# Patient Record
Sex: Male | Born: 1940 | Race: White | Hispanic: No | Marital: Single | Smoking: Former smoker
Health system: Southern US, Community
[De-identification: ages and names within clinical notes are randomized; demographics above are authoritative.]

## PROBLEM LIST (undated history)

## (undated) DIAGNOSIS — N529 Male erectile dysfunction, unspecified: Secondary | ICD-10-CM

## (undated) DIAGNOSIS — F329 Major depressive disorder, single episode, unspecified: Secondary | ICD-10-CM

## (undated) DIAGNOSIS — E291 Testicular hypofunction: Secondary | ICD-10-CM

## (undated) DIAGNOSIS — N400 Enlarged prostate without lower urinary tract symptoms: Secondary | ICD-10-CM

## (undated) DIAGNOSIS — N433 Hydrocele, unspecified: Secondary | ICD-10-CM

## (undated) DIAGNOSIS — K759 Inflammatory liver disease, unspecified: Secondary | ICD-10-CM

## (undated) DIAGNOSIS — F32A Depression, unspecified: Secondary | ICD-10-CM

## (undated) DIAGNOSIS — I10 Essential (primary) hypertension: Secondary | ICD-10-CM

## (undated) DIAGNOSIS — E785 Hyperlipidemia, unspecified: Secondary | ICD-10-CM

## (undated) DIAGNOSIS — F419 Anxiety disorder, unspecified: Secondary | ICD-10-CM

## (undated) DIAGNOSIS — N486 Induration penis plastica: Secondary | ICD-10-CM

## (undated) DIAGNOSIS — G4730 Sleep apnea, unspecified: Secondary | ICD-10-CM

## (undated) HISTORY — DX: Major depressive disorder, single episode, unspecified: F32.9

## (undated) HISTORY — DX: Male erectile dysfunction, unspecified: N52.9

## (undated) HISTORY — DX: Essential (primary) hypertension: I10

## (undated) HISTORY — DX: Induration penis plastica: N48.6

## (undated) HISTORY — DX: Hyperlipidemia, unspecified: E78.5

## (undated) HISTORY — DX: Sleep apnea, unspecified: G47.30

## (undated) HISTORY — PX: EYE SURGERY: SHX253

## (undated) HISTORY — DX: Benign prostatic hyperplasia without lower urinary tract symptoms: N40.0

## (undated) HISTORY — DX: Depression, unspecified: F32.A

## (undated) HISTORY — DX: Anxiety disorder, unspecified: F41.9

## (undated) HISTORY — DX: Testicular hypofunction: E29.1

## (undated) HISTORY — DX: Hydrocele, unspecified: N43.3

## (undated) HISTORY — PX: CATARACT EXTRACTION: SUR2

## (undated) HISTORY — PX: HYDROCELE EXCISION: SHX482

## (undated) HISTORY — DX: Inflammatory liver disease, unspecified: K75.9

---

## 1985-01-18 HISTORY — PX: FINGER AMPUTATION: SHX636

## 1988-01-19 HISTORY — PX: VARICOCELECTOMY: SHX1084

## 2006-04-29 ENCOUNTER — Ambulatory Visit: Payer: Self-pay | Admitting: Gastroenterology

## 2011-10-14 ENCOUNTER — Emergency Department: Payer: Self-pay | Admitting: Emergency Medicine

## 2011-10-14 ENCOUNTER — Observation Stay: Payer: Self-pay | Admitting: Internal Medicine

## 2011-10-14 LAB — PROTIME-INR
INR: 0.9
Prothrombin Time: 12.3 secs (ref 11.5–14.7)

## 2011-10-14 LAB — CBC
HCT: 46 % (ref 40.0–52.0)
MCH: 31.5 pg (ref 26.0–34.0)
MCHC: 34.8 g/dL (ref 32.0–36.0)
MCV: 91 fL (ref 80–100)
RDW: 12.9 % (ref 11.5–14.5)

## 2011-10-14 LAB — COMPREHENSIVE METABOLIC PANEL
Anion Gap: 11 (ref 7–16)
BUN: 10 mg/dL (ref 7–18)
Bilirubin,Total: 0.8 mg/dL (ref 0.2–1.0)
Chloride: 102 mmol/L (ref 98–107)
Co2: 26 mmol/L (ref 21–32)
Creatinine: 0.84 mg/dL (ref 0.60–1.30)
EGFR (African American): >60
EGFR (Non-African Amer.): >60
Glucose: 97 mg/dL (ref 65–99)
Osmolality: 277 (ref 275–301)
SGPT (ALT): 30 U/L (ref 12–78)
Sodium: 139 mmol/L (ref 136–145)
Total Protein: 7.8 g/dL (ref 6.4–8.2)

## 2011-10-14 LAB — APTT: Activated PTT: 25.5 secs (ref 23.6–35.9)

## 2011-10-15 LAB — CBC WITH DIFFERENTIAL/PLATELET
Eosinophil #: 0.1 10*3/uL (ref 0.0–0.7)
Eosinophil %: 1.7 %
Lymphocyte #: 2.2 10*3/uL (ref 1.0–3.6)
MCH: 30.8 pg (ref 26.0–34.0)
MCHC: 33.7 g/dL (ref 32.0–36.0)
MCV: 91 fL (ref 80–100)
Monocyte #: 1.2 x10 3/mm — ABNORMAL HIGH (ref 0.2–1.0)
Neutrophil #: 5.3 10*3/uL (ref 1.4–6.5)
Neutrophil %: 59.7 %
Platelet: 329 10*3/uL (ref 150–440)
RBC: 5.1 10*6/uL (ref 4.40–5.90)
RDW: 13.1 % (ref 11.5–14.5)

## 2011-10-15 LAB — LIPID PANEL
Cholesterol: 199 mg/dL (ref 0–200)
Ldl Cholesterol, Calc: 96 mg/dL (ref 0–100)
Triglycerides: 89 mg/dL (ref 0–200)
VLDL Cholesterol, Calc: 18 mg/dL (ref 5–40)

## 2011-10-15 LAB — BASIC METABOLIC PANEL
Anion Gap: 8 (ref 7–16)
BUN: 15 mg/dL (ref 7–18)
Chloride: 103 mmol/L (ref 98–107)
Co2: 27 mmol/L (ref 21–32)
Creatinine: 0.92 mg/dL (ref 0.60–1.30)
EGFR (African American): >60
Osmolality: 276 (ref 275–301)
Potassium: 4.2 mmol/L (ref 3.5–5.1)

## 2014-01-28 ENCOUNTER — Ambulatory Visit: Payer: Self-pay | Admitting: Urology

## 2014-02-12 ENCOUNTER — Ambulatory Visit: Payer: Self-pay | Admitting: Anesthesiology

## 2014-02-12 DIAGNOSIS — I10 Essential (primary) hypertension: Secondary | ICD-10-CM

## 2014-02-20 ENCOUNTER — Ambulatory Visit: Payer: Self-pay | Admitting: Urology

## 2014-03-28 ENCOUNTER — Ambulatory Visit: Payer: Self-pay | Admitting: Urology

## 2014-05-07 NOTE — Discharge Summary (Signed)
PATIENT NAME:  Brady Bryant, Brady Bryant MR#:  [id number] DATE OF BIRTH:  November 08, 1940  DATE OF ADMISSION:  10/14/2011 DATE OF DISCHARGE:  10/15/2011  PRIMARY CARE PHYSICIAN: [facility]    FINAL DIAGNOSES:  1. Transient ischemic attack.  2. Elevated blood pressure without diagnosis of hypertension.  3. Chest pain, which is transient. Recommend outpatient stress test.  4. Alcohol use, cut back to one drink per day.   MEDICATIONS ON DISCHARGE:  1. Aspirin 81 mg daily.  2. Atorvastatin 20 mg daily.   DO NOT TAKE: Ibuprofen.   DIET: Low sodium diet, less than 2400 mg of sodium chloride per day, regular consistency.   ACTIVITY: Activity as tolerated.   [facility]: [facility] Dr. [place]    REASON FOR ADMISSION: The patient was admitted 10/14/2011, discharged 10/15/2011. The patient was in the Emergency Room, signed out AGAINST MEDICAL ADVICE, and came back to the Emergency Room presented with left-sided numbness. The patient was admitted for transient ischemic attack. MRI of the brain, echocardiogram, and carotid ultrasound was ordered. Aspirin and Lipitor was started.   LABORATORY AND RADIOLOGICAL DATA DURING THE HOSPITAL COURSE: EKG showed normal sinus rhythm, no acute ST-T wave changes.   Glucose 97, BUN 10, creatinine 0.84, sodium 139, potassium 3.7, chloride 102, CO2 26, calcium 9.5. Liver function tests normal. White blood cell count 9.3, hemoglobin and hematocrit 16.0 and 46.0, platelet count 346. PT, INR, and PTT normal range. Troponin initially negative.   Chest x-ray negative.   CT scan of the head showed no acute intracranial abnormality.   Ultrasound of the carotids showed no sonographic evidence of hemodynamically significant stenosis with either the left or right carotid system.   LDL 96. HDL 85. Triglycerides 89.  MRI of the brain shows no acute intracranial pathology.   Echocardiogram pending upon discharge.   HOSPITAL COURSE PER PROBLEM LIST:   1. For the patient's transient ischemic attack, his left-sided symptoms had resolved. The patient has good power, 5 out of 5, bilaterally. The patient was anxious to go home prior to echocardiogram being resulted. Since the patient's MRI of the brain and carotid ultrasound were negative, it is okay for him to go home on aspirin and Lipitor. I will treat this as a transient ischemic attack.  2. Elevated blood pressure without diagnosis of hypertension. The patient is very anxious here. I believe that may have some role with his blood pressure. His last blood pressure was 146/96 but it was 134/81 prior. I do recommend following up as outpatient to check blood pressure.  3. For the patient's chest pain which was transient, I recommend an outpatient stress test. Unfortunately, cardiac enzymes were not ordered during the hospital course. No arrhythmias on telemetry. Continue aspirin.  4. Alcohol use. I advised the patient to cut back to one drink per day.   TIME SPENT ON DISCHARGE: 35 minutes.   ____________________________ Herschell Dimesichard J. Renae GlossWieting, MD rjw:drc D: 10/15/2011 15:31:31 ET T: 10/16/2011 13:49:40 ET JOB#: [id number]  cc: Herschell Dimesichard J. Renae GlossWieting, MD, <Dictator>, [facility] Salley ScarletICHARD J Yochanan Eddleman MD ELECTRONICALLY SIGNED 10/19/2011 21:24

## 2014-05-07 NOTE — H&P (Signed)
PATIENT NAME:  Brady Bryant, Brady MR#:  [id number] DATE OF BIRTH:  [birth year]  DATE OF ADMISSION:  10/14/2011  PRIMARY CARE PHYSICIAN:  None    HISTORY OF PRESENT ILLNESS:  The patient is a 74 year old Caucasian male with past medical history significant for history of hyperlipidemia as well as prostatitis who presented to  the Emergency Room twice today. The first time he came in early in the morning when he had some chest pains and shortness of breath. He also noted that his left lower extremity and left arm were numb. He also had left lower extremity weakness. It lasted approximately 1-1/2 hours and he decided to come to the Emergency Room for further evaluation. In the Emergency Room his symptoms subsided. He had a CT scan of his head, which was unremarkable. He was given some aspirin and he returned back home AGAINST MEDICAL ADVICE. At around 12:00 or 1:00 p.m. he again started having problems with left-sided numbness as well as left lower extremity weakness. He was not even able to put weight on this leg. He could not walk. This weakness lasted approximately four hours.  This time he came to the Emergency Room for further evaluation and decided to stay. The Emergency Room did not repeat the patient's CT scan this time as his symptoms again subsided while in the Emergency Room. Now he feels quite okay. He does not have any upper or lower extremity weakness or numbness. His chest pain was described as midsternal, a dull ache approximately 2 to 4 out of 10 in intensity, lasting for a few hours, accompanied by shortness of breath. Pain was constant with no radiation and it was not associated with changes with deep breathing or walking around. He had no recurrence of his chest pains, however had some shortness of breath again in the middle of the day.   PAST MEDICAL HISTORY:  1. Hyperlipidemia. 2. Prostatitis.   MEDICATIONS: None, however he took a few aspirins earlier today.   PAST SURGICAL HISTORY:   1. 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  2. Varicocele operation.   FAMILY HISTORY: Hypertension in patient's brother. The patient's mother had cerebrovascular accident at the age of 72. No cancers, early coronary artery disease or cerebrovascular accidents.   SOCIAL HISTORY: The patient is married, has three children total. He smoked 1 pack per day for 20 years, quit in 1987. Drinks alcohol, two to three glasses at night. He usually buys approximately a 2.5-liter box for a week. He is retired, however, he works intermittently.  REVIEW OF SYSTEMS:  Positive for chest pain, shortness of breath, weight gain as well as loss. He states that he lost approximately 20 pounds in the past eight months. He uses bifocal glasses. He has been having shortness of breath over the past day. He had early in the morning shortness of breath associated with chest pains as well as later today. He had also left-sided numbness as well as left lower extremity weakness. He was not able to support his weight. Otherwise, denies any fevers, chills, fatigue, weakness, weight loss. EYES: In regards to eyes, denies any blurred vision, double vision, glaucoma, or cataracts.  ENT: Denies any tinnitus, allergies, epistaxis, sinus pain, dentures, or difficulty swallowing.  RESPIRATORY:  Denies any cough, wheezes, asthma, or chronic obstructive pulmonary disease. CARDIOVASCULAR: Denies any orthopnea, edema, arrhythmias, palpitations, or syncope.  GI: Denies nausea, vomiting, diarrhea, or constipation. GU: Denies dysuria, hematuria, frequency, or incontinence. ENDOCRINE:  Denies polydipsia, nocturia, thyroid  problems, heat or cold intolerance, or thirst.  HEMATOLOGIC: Denies anemia, easy bruising or bleeding, or swollen glands. SKIN: Denies any acne, rashes, lesions, or change in moles. MUSCULOSKELETAL: Denies arthritis, cramps, swelling, or gout. NEUROLOGIC: No numbness, epilepsy, or tremor. PSYCH:  Denies anxiety,  insomnia, or depression.   PHYSICAL EXAMINATION:  VITAL SIGNS: On arrival to the hospital, temperature 97.9, pulse 98, respiratory rate 22, blood pressure 160/91, saturation 96% room air.   GENERAL: This is a thin Caucasian male in no significant distress sitting on the stretcher.   HEENT: His pupils are equal and reactive to light.  Extraocular movements intact.  No icterus or conjunctivitis.  He has normal hearing. No pharyngeal erythema. Mucosa is moist.   NECK: No masses. Supple, nontender. Thyroid not enlarged. No adenopathy. No JVD or carotid bruits bilaterally. Full range of motion.   LUNGS: Clear to auscultation in all fields. No rales, rhonchi, diminished breath sounds, or wheezing. No labored inspirations, increased effort, dullness to percussion, or overt respiratory distress.   CARDIOVASCULAR: S1, S2.  No murmurs, rubs, or gallops noted.  PMI not lateralized. Chest is nontender to palpation.  2+ pedal pulses. No lower extremity edema, calf tenderness, or cyanosis was noted.   ABDOMEN: Soft, nontender. Bowel sounds are present. No hepatosplenomegaly or masses were noted.   RECTAL: Deferred.   MUSCULOSKELETAL: Muscle strength. Able to move all extremities, 5/5. No cyanosis, degenerative joint disease, or kyphosis. Gait is not tested.   SKIN: Skin did not reveal any rashes, lesions, erythema, nodularity, or induration. It was warm and dry to palpation.   LYMPH: No adenopathy in the cervical region.   NEUROLOGICAL: Cranial nerves grossly intact. Sensory is intact. The patient does have Babinski on the left. No dysarthria or aphasia.   PSYCHIATRIC: The patient is alert, oriented to time, person, and place, cooperative. Memory is good. No significant confusion, agitation, or depression noted.   LABORATORY, DIAGNOSTIC, AND RADIOLOGICAL DATA: BNP was within normal limits. The patient's liver enzymes were normal. Troponin was less than 0.02. The patient's white blood cell count is  9.3, hemoglobin 16, platelet count 346. Coagulation panel unremarkable.   Radiologic studies: Chest portable, single view, 10/14/2011 showed no evidence of acute cardiopulmonary abnormality. CT scan of the head without contrast: No acute intracranial abnormality.   ASSESSMENT AND PLAN:  1. Transient ischemic attack, recurrent: Admit the patient to the medical floor.  Continue him on aspirin as well as Lipitor. Get lipid panel in the morning. Get echocardiogram as well as carotid ultrasound. If his symptoms recur, we may need to have an MRI of his brain. Meanwhile he is asymptomatic at this time except for mild Babinski on the left. 2. Elevated blood pressure without diagnosis of hypertension: We will watch the patient's blood pressure readings and initiate blood pressure medications if needed.  3. Chest pain concerning for unstable angina: We will get an echocardiogram. We will continue aspirin and Lipitor. The patient will need a stress test as an outpatient.  4. Alcohol abuse: Watch for withdrawal.  5. EKG: The patient's EKG revealed normal sinus rhythm at 84 beats per minute, normal axis, minimal voltage criteria for left ventricular hypertrophy, may be normal variant. No acute ST-T changes were noted.   TIME SPENT: 50 minutes.    ____________________________ Katharina Caper, MD rv:bjt D: 10/14/2011 18:10:30 ET T: 10/15/2011 06:54:41 ET JOB#: [id number]  cc: Katharina Caper, MD, <Dictator> Rusty Villella MD ELECTRONICALLY SIGNED 10/15/2011 15:11

## 2014-05-13 LAB — SURGICAL PATHOLOGY

## 2014-05-19 NOTE — Op Note (Signed)
PATIENT NAME:  Brady Bryant, Brady Bryant MR#:  [id number] DATE OF BIRTH:  07-08-1940  DATE OF PROCEDURE:  02/20/2014  PREOPERATIVE DIAGNOSIS:  Hydrocele.  POSTOPERATIVE DIAGNOSIS:  Hydrocele, spermatocele.   PROCEDURE PERFORMED: Hydrocelectomy, spermatocelectomy.   ATTENDING SURGEON: Claris GladdenAshley J. Zakery Normington, MD   ANESTHESIA: General anesthesia.   ESTIMATED BLOOD LOSS: Minimal.   DRAINS: None.   COMPLICATIONS: None.   SPECIMENS: Hydrocele sac.   INDICATION: This is a 74 year old male with a symptomatic right hydrocele. He was counseled on his various treatment options and has elected to undergo right hydrocelectomy.  Risks and benefits of the procedure were explained in detail. The patient agreed to proceed as planned.   DESCRIPTION OF PROCEDURE: The patient was correctly identified in the preoperative holding area and informed consent was obtained. He was brought to the operating suite and placed on the table in the supine position. At this time universal timeout protocol was performed. All team members were identified. Venodyne boots were placed and he was administered IV Ancef in the perioperative period. He was then placed under general anesthesia and shaved by the attending surgeon and prepped using ChloraPrep and draped in the standard surgical fashion. At this point in time, the scrotum was examined which revealed a moderately tense right-sided hydrocele approximately softball size.   The site of planned incision was performed over the right hemiscrotum in a transverse fashion along Langerhans lines; 0.5% plain local anesthesia was administered at the site of the planned incision, and a knife was used to incise the skin and the subcutaneous tissues were taken down using Bovie electrocautery. The hydrocele sac and testicle were then able to be delivered through the incision into the field. The hydrocele sac was then incised and the fluid from the hydrocele was aspirated, approximately 200 mL of  straw-colored fluid was evacuated. The edges of the hydrocele sac were then excised with care taken to avoid any injury to the cord or epididymis. The edges of the hydrocele sac were then oversewn and everted to prevent recurrence of the hydrocele sac. The remainder of the scrotum was carefully inspected and hemostasis was achieved. This was then copiously irrigated with antibiotic solution. There was an approximately 2 cm cyst on the epididymis consistent with a spermatocele and this structure was carefully dissected free and removed as well. The testicle was then returned right back into the right hemiscrotum. The scrotum was then closed in 2 layers using a running 3-0 Vicryl to close the dartos and a series of interrupted chromic sutures to close the scrotal skin. Dermabond solution was then used as an additional dressing. A scrotal fluff and scrotal support device was applied. The patient was reversed from anesthesia and taken to the PACU in stable condition. There were no complications in this case.   ____________________________ Claris GladdenAshley J. Kimiah Hibner, MD ajb:LT D: 02/20/2014 14:23:21 ET T: 02/20/2014 16:29:46 ET JOB#: [id number]  cc: Claris GladdenAshley J. Jamilee Lafosse, MD, <Dictator> Claris GladdenASHLEY J Purnell Daigle MD ELECTRONICALLY SIGNED 03/12/2014 12:35

## 2014-10-25 ENCOUNTER — Telehealth: Payer: Self-pay

## 2014-10-25 ENCOUNTER — Ambulatory Visit (INDEPENDENT_AMBULATORY_CARE_PROVIDER_SITE_OTHER): Payer: Medicare Other | Admitting: Urology

## 2014-10-25 ENCOUNTER — Encounter: Payer: Self-pay | Admitting: Urology

## 2014-10-25 VITALS — BP 182/90 | HR 76 | Ht 67.0 in | Wt 144.4 lb

## 2014-10-25 DIAGNOSIS — N529 Male erectile dysfunction, unspecified: Secondary | ICD-10-CM | POA: Diagnosis not present

## 2014-10-25 DIAGNOSIS — N433 Hydrocele, unspecified: Secondary | ICD-10-CM

## 2014-10-25 LAB — URINALYSIS, COMPLETE
Bilirubin, UA: NEGATIVE
Glucose, UA: NEGATIVE
Ketones, UA: NEGATIVE
LEUKOCYTES UA: NEGATIVE
Nitrite, UA: NEGATIVE
PH UA: 7 (ref 5.0–7.5)
PROTEIN UA: NEGATIVE
RBC, UA: NEGATIVE
Specific Gravity, UA: 1.01 (ref 1.005–1.030)
Urobilinogen, Ur: 0.2 mg/dL (ref 0.2–1.0)

## 2014-10-25 LAB — MICROSCOPIC EXAMINATION
BACTERIA UA: NONE SEEN
Epithelial Cells (non renal): NONE SEEN /hpf (ref 0–10)
RBC, UA: NONE SEEN /hpf (ref 0–?)
RENAL EPITHEL UA: NONE SEEN /HPF
WBC, UA: NONE SEEN /hpf (ref 0–?)

## 2014-10-25 NOTE — Progress Notes (Signed)
10/25/2014 9:38 AM   Brady Bryant Jun 23, 1940 [id number]  Referring provider: No referring provider defined for this encounter.  Chief Complaint  Patient presents with  . Hydrocele    74month    HPI: The patient is a 74 year old male who presents for follow-up for his hydrocelectomy and erectile disruption. He had a hydrocelectomy on fiber third 2016. He had some swelling and tenderness after surgery. This is mostly resolved. There is no more fluid collection per the patient.  The patient also has erectile dysfunction.  He failed oral PDE 5 inhibitors. He has been on Trimix. This is working well. He will a refill this medication.    PMH: Past Medical History  Diagnosis Date  . ED (erectile dysfunction)   . BPH (benign prostatic hyperplasia)   . Hypogonadism in male   . Peyronie disease   . Anxiety and depression   . Sleep apnea   . Hyperlipemia   . Hepatitis   . Hydrocele, right   . Hypertension     Surgical History: Past Surgical History  Procedure Laterality Date  . Hydrocele excision  [id number]  . Finger amputation  1987  . Cataract extraction Right   . Varicocelectomy  1990    [facility]  . Eye surgery Right     Home Medications:    Medication List    Notice  As of 10/25/2014  9:38 AM   You have not been prescribed any medications.      Allergies: No Known Allergies  Family History: Family History  Problem Relation Age of Onset  . Stomach cancer Mother   . Prostate cancer Neg Hx     Social History:  reports that he has quit smoking. He does not have any smokeless tobacco history on file. He reports that he drinks alcohol. He reports that he does not use illicit drugs.  ROS: UROLOGY Frequent Urination?: No Hard to postpone urination?: No Burning/pain with urination?: No Get up at night to urinate?: No Leakage of urine?: No Urine stream starts and stops?: No Trouble starting stream?: No Do you have to strain to urinate?: No Blood in urine?:  No Urinary tract infection?: No Sexually transmitted disease?: No Injury to kidneys or bladder?: No Painful intercourse?: No Weak stream?: No Erection problems?: No Penile pain?: No  Gastrointestinal Nausea?: No Vomiting?: No Indigestion/heartburn?: No Diarrhea?: No Constipation?: No  Constitutional Fever: No Night sweats?: No Weight loss?: No Fatigue?: No  Skin Skin rash/lesions?: No Itching?: No  Eyes Blurred vision?: No Double vision?: No  Ears/Nose/Throat Sore throat?: No Sinus problems?: No  Hematologic/Lymphatic Swollen glands?: No Easy bruising?: No  Cardiovascular Leg swelling?: No Chest pain?: No  Respiratory Cough?: No Shortness of breath?: No  Endocrine Excessive thirst?: No  Musculoskeletal Back pain?: No Joint pain?: No  Neurological Headaches?: No Dizziness?: No  Psychologic Depression?: No Anxiety?: No  Physical Exam: BP 182/90 mmHg  Pulse 76  Ht  (1.702 m)  Wt 144 lb 6.4 oz (65.499 kg)  BMI 22.61 kg/m2  Constitutional:  Alert and oriented, No acute distress. HEENT: [place] AT, moist mucus membranes.  Trachea midline, no masses. Cardiovascular: No clubbing, cyanosis, or edema. Respiratory: Normal respiratory effort, no increased work of breathing. GI: Abdomen is soft, nontender, nondistended, no abdominal masses GU: No CVA tenderness. Normal phallus. Testicles descended equally bilaterally. Right testicle is somewhat tender to palpation. No sign of infection. No hydrocele. Skin: No rashes, bruises or suspicious lesions. Lymph: No cervical or inguinal adenopathy. Neurologic: Grossly intact,  no focal deficits, moving all 4 extremities. Psychiatric: Normal mood and affect.  Laboratory Data: Lab Results  Component Value Date   WBC 8.8 10/15/2011   HGB 15.7 10/15/2011   HCT 46.5 10/15/2011   MCV 91 10/15/2011   PLT 329 10/15/2011    Lab Results  Component Value Date   CREATININE 0.92 10/15/2011    No results found  for: PSA  No results found for: TESTOSTERONE  No results found for: HGBA1C  Urinalysis No results found for: COLORURINE, APPEARANCEUR, LABSPEC, PHURINE, GLUCOSEU, HGBUR, BILIRUBINUR, KETONESUR, PROTEINUR, UROBILINOGEN, NITRITE, LEUKOCYTESUR    Assessment & Plan:    1. Hydrocele, unspecified hydrocele type Resolved  2. Erectile dysfunction, unspecified erectile dysfunction type Continue Trimix. A prescription was called in for a refill. He'll follow-up in one year's time to reevaluate his erectile dysfunction.   Return in about 1 year (around 10/25/2015).  Hildred Laser, MD  [facility] [address] [place], [place] [address] [phone]

## 2014-10-25 NOTE — Telephone Encounter (Signed)
Trimix medication was called into [facility].

## 2015-10-28 ENCOUNTER — Telehealth: Payer: Self-pay | Admitting: Urology

## 2015-10-28 NOTE — Telephone Encounter (Signed)
I had to move the patient's appt from 10-31-15 to 11-07-15 due to no provider in the office and his script for Trimix ran out on 10-25-15 can he get another one called in to the pharmacy before his appt on the 20th? Please let the patient know.  [phone]   Thanks,  michelle

## 2015-10-28 NOTE — Telephone Encounter (Signed)
Refills called into pharmacy. Pt made aware.

## 2015-10-31 ENCOUNTER — Ambulatory Visit: Payer: Medicare Other

## 2015-10-31 IMAGING — US US SCROTUM W/ DOPPLER COMPLETE
1 series · 14 of 25 positions shown · non-contrast
Comparison: None.

CLINICAL DATA: Right hydrocele. Constant dull ache and swelling in
the right testicle. Increased discomfort over the past few months.

EXAM:
SCROTAL ULTRASOUND
DOPPLER ULTRASOUND OF THE TESTICLES
TECHNIQUE: Complete ultrasound examination of the testicles, epididymis, and
other scrotal structures was performed. Color and spectral Doppler
ultrasound were also utilized to evaluate blood flow to the
testicles.

[Series 1: us scrotum w/ doppler complete · 0.10mm/px · 14 of 65 slices shown]
[im 1/65]
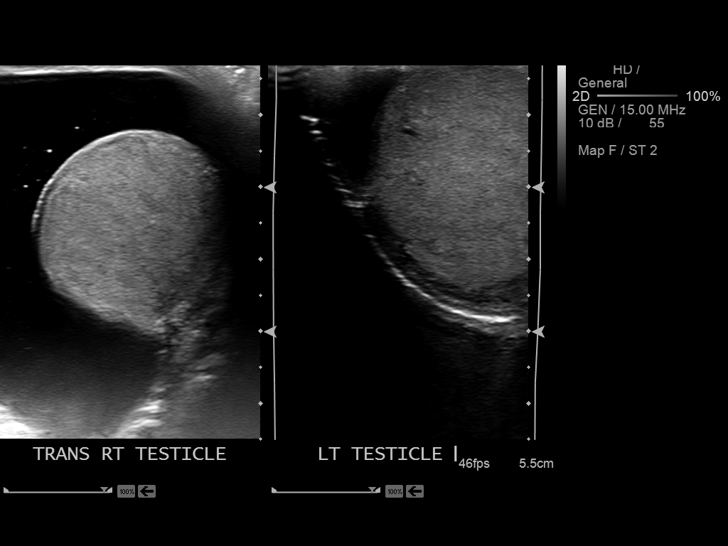
[im 6/65]
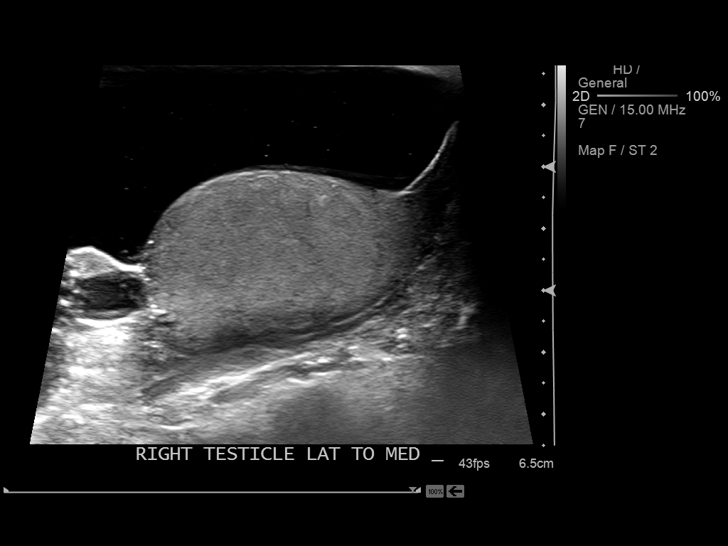
[im 11/65]
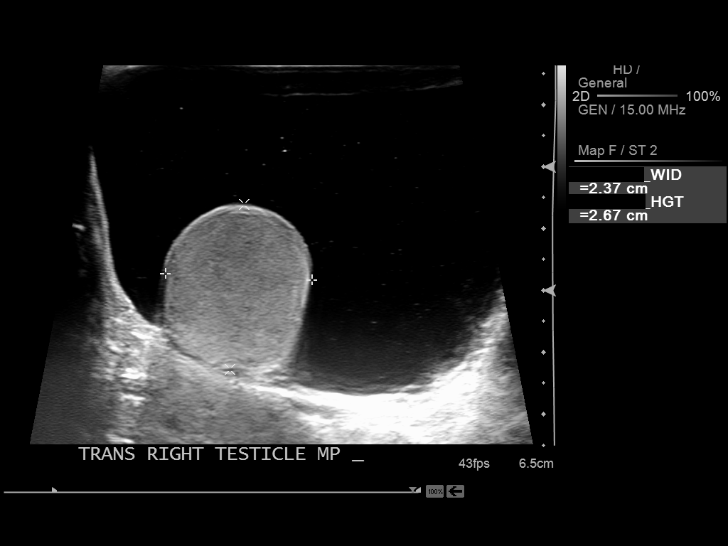
[im 17/65]
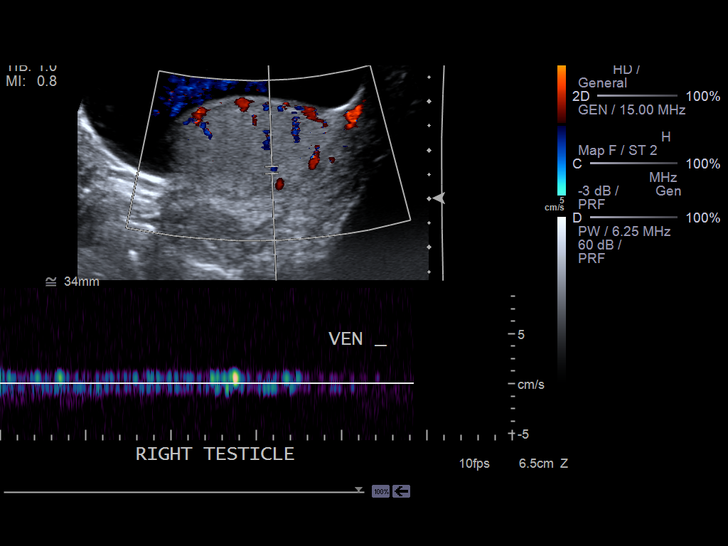
[im 22/65]
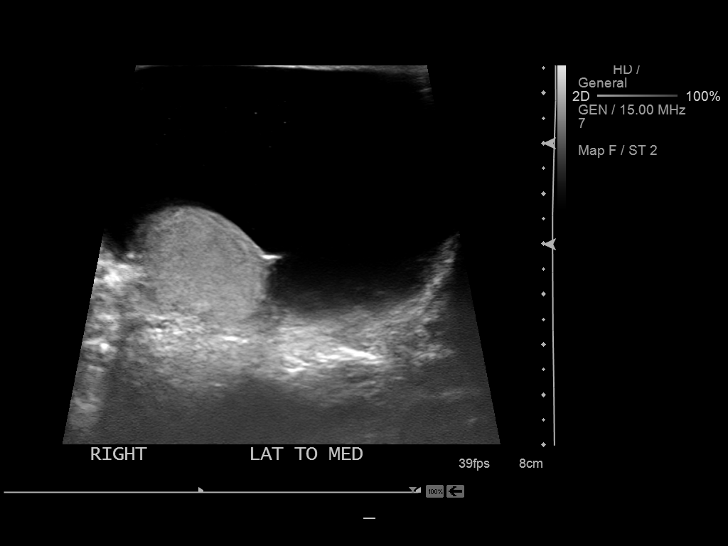
[im 25/65]
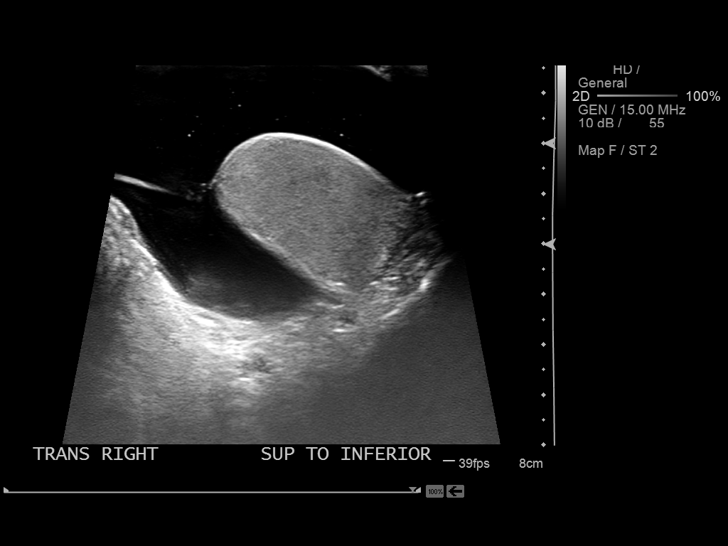
[im 30/65]
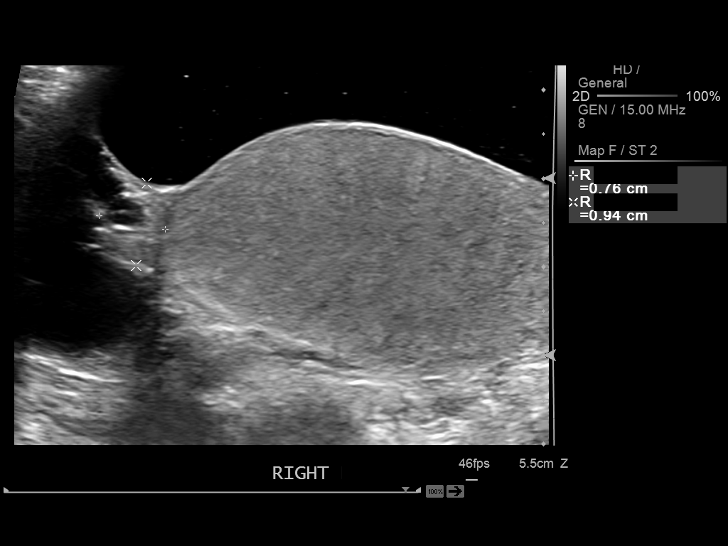
[im 35/65]
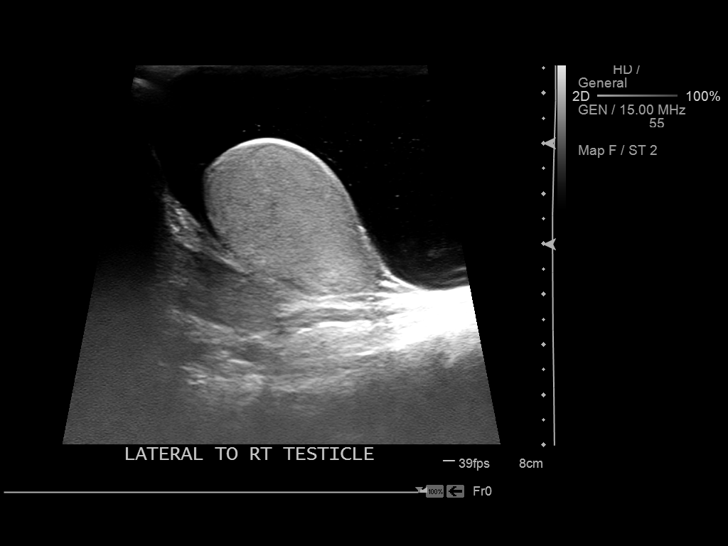
[im 41/65]
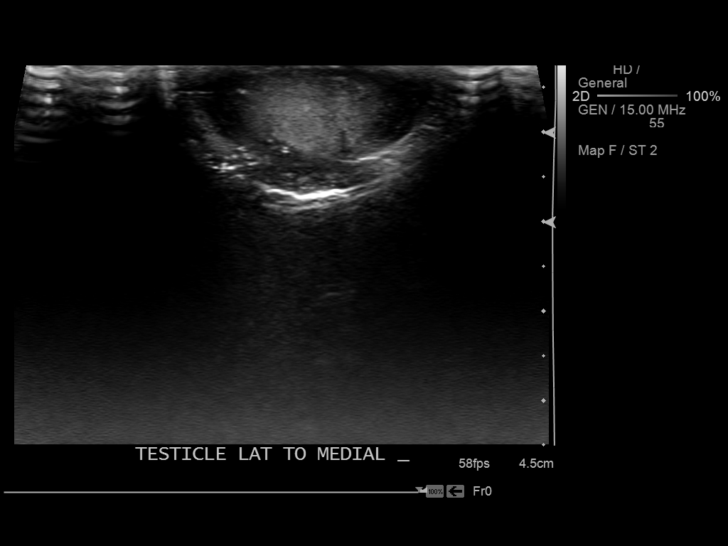
[im 43/65]
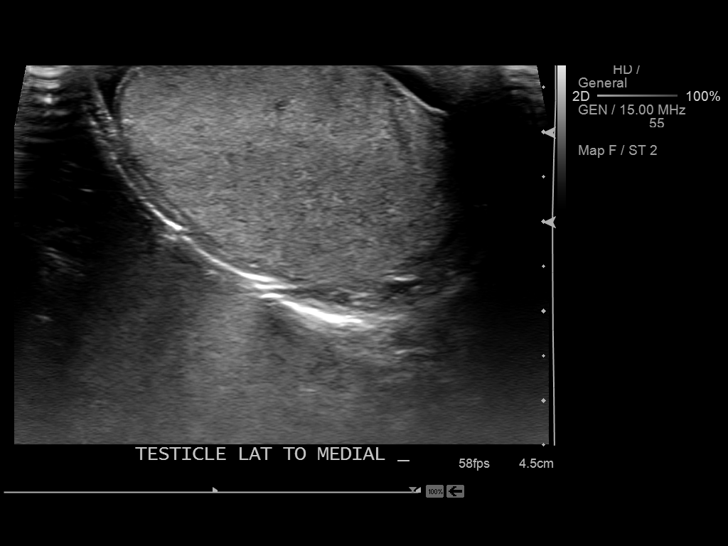
[im 49/65]
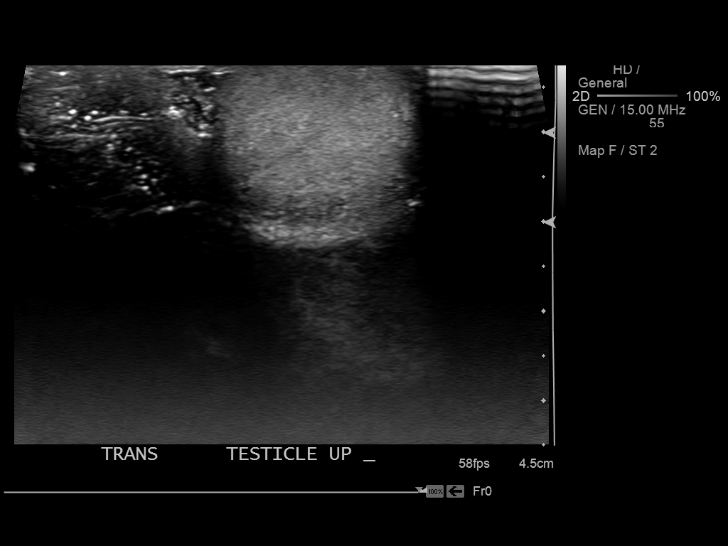
[im 54/65]
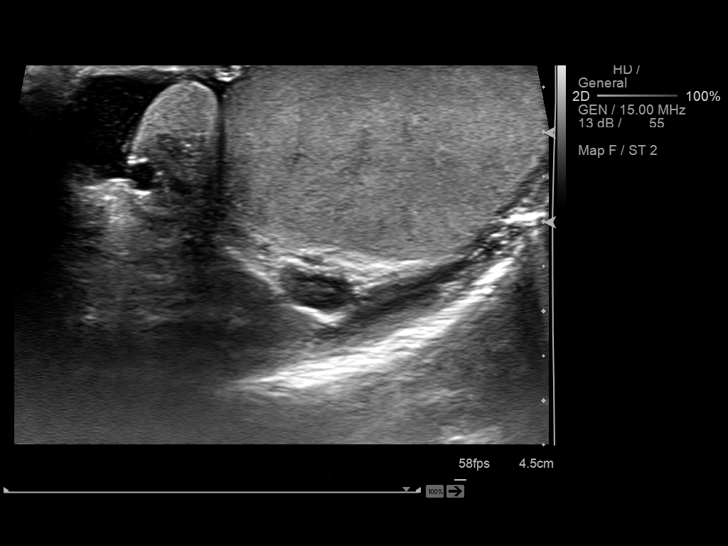
[im 59/65]
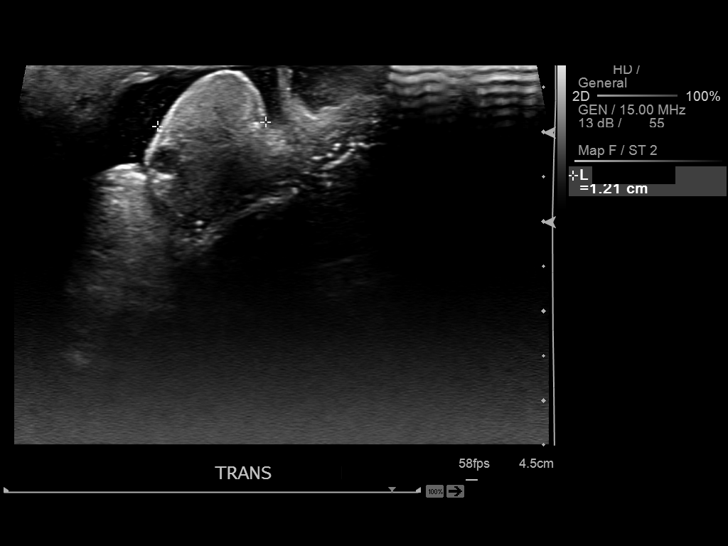
[im 65/65]
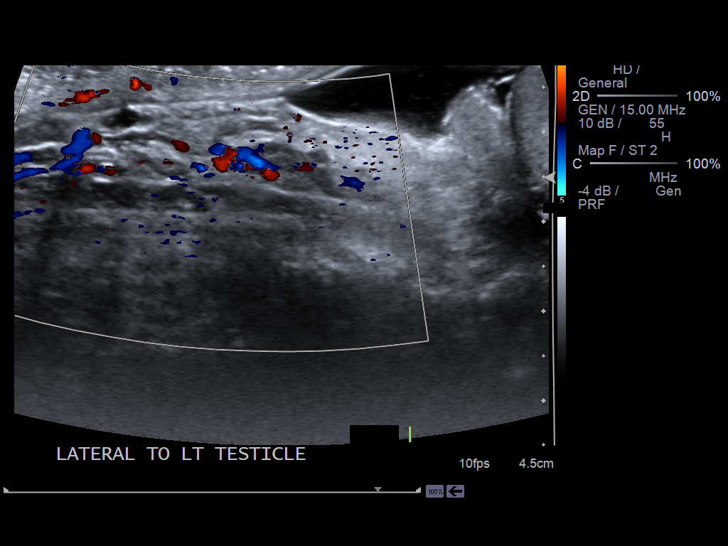

[14 of 25 positions shown; findings below may reference images not displayed]

FINDINGS: Right testicle

Measurements: 4.6 x 2.7 x 2.4 cm. No mass or microlithiasis
visualized.

Left testicle

Measurements: 4.1 x 2.8 x 3.1 cm. No mass or microlithiasis
visualized.

Right epididymis: Small epididymal cyst/spermatocele is 0.4 x 0.3 x
0.5 cm

Left epididymis: Small epididymal cyst/spermatocele is 0.4 x 0.3 x
0.4 Cm

Hydrocele:  Large right hydrocele is present.

Varicocele:  None visualized.

Pulsed Doppler interrogation of both testes demonstrates low
resistance arterial and venous waveforms bilaterally.
IMPRESSION: 1. No evidence for testicular mass or torsion.
2. Large right hydrocele.

## 2015-11-07 ENCOUNTER — Ambulatory Visit (INDEPENDENT_AMBULATORY_CARE_PROVIDER_SITE_OTHER): Payer: Medicare Other | Admitting: Urology

## 2015-11-07 ENCOUNTER — Encounter: Payer: Self-pay | Admitting: Urology

## 2015-11-07 VITALS — BP 182/112 | HR 91 | Ht 67.0 in | Wt 149.8 lb

## 2015-11-07 DIAGNOSIS — N529 Male erectile dysfunction, unspecified: Secondary | ICD-10-CM | POA: Diagnosis not present

## 2015-11-07 DIAGNOSIS — N430 Encysted hydrocele: Secondary | ICD-10-CM | POA: Diagnosis not present

## 2015-11-07 NOTE — Progress Notes (Signed)
11/07/2015 2:27 PM   Brady Bryant 1940-05-19 [id number]  Referring provider: No referring provider defined for this encounter.  Chief Complaint  Patient presents with  . Hydrocele    1 yr f/u    HPI: The patient is a 75 year old gentleman who presents for annual follow-up.   1. Erectile dysfunction Managed well with Trimix. No complaints.  2. Right Hydrocele Status post hydrocelectomy in December 2016.     Patient denies any other complaints at this time. He denies nocturia. He denies urinary issues. No urinary tract infections, hematuria or nephrolithiasis over last year.  PMH: Past Medical History:  Diagnosis Date  . Anxiety and depression   . BPH (benign prostatic hyperplasia)   . ED (erectile dysfunction)   . Hepatitis   . Hydrocele, right   . Hyperlipemia   . Hypertension   . Hypogonadism in male   . Peyronie disease   . Sleep apnea     Surgical History: Past Surgical History:  Procedure Laterality Date  . CATARACT EXTRACTION Right   . EYE SURGERY Right   . FINGER AMPUTATION  1987  . HYDROCELE EXCISION  [id number]  . VARICOCELECTOMY  1990   [facility]    Home Medications:    Medication List    as of 11/07/2015  2:27 PM   You have not been prescribed any medications.     Allergies: No Known Allergies  Family History: Family History  Problem Relation Age of Onset  . Stomach cancer Mother   . Prostate cancer Neg Hx     Social History:  reports that he has quit smoking. He does not have any smokeless tobacco history on file. He reports that he drinks alcohol. He reports that he does not use drugs.  ROS: UROLOGY Frequent Urination?: No Hard to postpone urination?: No Burning/pain with urination?: No Get up at night to urinate?: No Leakage of urine?: No Urine stream starts and stops?: No Trouble starting stream?: No Do you have to strain to urinate?: No Blood in urine?: No Urinary tract infection?: No Sexually transmitted disease?:  No Injury to kidneys or bladder?: No Painful intercourse?: No Weak stream?: No Erection problems?: Yes Penile pain?: No  Gastrointestinal Nausea?: No Vomiting?: No Indigestion/heartburn?: No Diarrhea?: No Constipation?: No  Constitutional Fever: No Night sweats?: No Weight loss?: No Fatigue?: No  Skin Skin rash/lesions?: No Itching?: No  Eyes Blurred vision?: No Double vision?: No  Ears/Nose/Throat Sore throat?: No Sinus problems?: No  Hematologic/Lymphatic Swollen glands?: No Easy bruising?: No  Cardiovascular Leg swelling?: No Chest pain?: No  Respiratory Cough?: No Shortness of breath?: No  Endocrine Excessive thirst?: No  Musculoskeletal Back pain?: No Joint pain?: No  Neurological Headaches?: No Dizziness?: No  Psychologic Depression?: No Anxiety?: No  Physical Exam: BP (!) 182/112   Pulse 91   Ht 5\' 7"  (1.702 m)   Wt 149 lb 12.8 oz (67.9 kg)   BMI 23.46 kg/m   Constitutional:  Alert and oriented, No acute distress. HEENT: [place] AT, moist mucus membranes.  Trachea midline, no masses. Cardiovascular: No clubbing, cyanosis, or edema. Respiratory: Normal respiratory effort, no increased work of breathing. GI: Abdomen is soft, nontender, nondistended, no abdominal masses GU: No CVA tenderness. Normal phallus. No evidence of hydrocele. Incisions well-healed. Skin: No rashes, bruises or suspicious lesions. Lymph: No cervical or inguinal adenopathy. Neurologic: Grossly intact, no focal deficits, moving all 4 extremities. Psychiatric: Normal mood and affect.  Laboratory Data: Lab Results  Component Value Date   WBC  8.8 10/15/2011   HGB 15.7 10/15/2011   HCT 46.5 10/15/2011   MCV 91 10/15/2011   PLT 329 10/15/2011    Lab Results  Component Value Date   CREATININE 0.92 10/15/2011    No results found for: PSA  No results found for: TESTOSTERONE  No results found for: HGBA1C  Urinalysis    Component Value Date/Time    APPEARANCEUR Clear 10/25/2014 0920   GLUCOSEU Negative 10/25/2014 0920   BILIRUBINUR Negative 10/25/2014 0920   PROTEINUR Negative 10/25/2014 0920   NITRITE Negative 10/25/2014 0920   LEUKOCYTESUR Negative 10/25/2014 0920    Assessment & Plan:    1. Right Hydrocele Resolved  2. Erectile dysfunction Continue Trimix. A prescription was called in for a refill. He'll follow-up in one year's time to reevaluate his erectile dysfunction.  There are no diagnoses linked to this encounter.  Return in about 1 year (around 11/06/2016).  [name], MD  [facility] [address], [address] [place], [place] [address] [phone]

## 2016-11-12 ENCOUNTER — Ambulatory Visit (INDEPENDENT_AMBULATORY_CARE_PROVIDER_SITE_OTHER): Payer: Medicare Other | Admitting: Urology

## 2016-11-12 ENCOUNTER — Encounter: Payer: Self-pay | Admitting: Urology

## 2016-11-12 VITALS — BP 188/106 | HR 81 | Ht 67.0 in

## 2016-11-12 DIAGNOSIS — N529 Male erectile dysfunction, unspecified: Secondary | ICD-10-CM

## 2016-11-12 DIAGNOSIS — N400 Enlarged prostate without lower urinary tract symptoms: Secondary | ICD-10-CM

## 2016-11-12 MED ORDER — TAMSULOSIN HCL 0.4 MG PO CAPS: 0.4000 mg | ORAL_CAPSULE | Freq: Every day | ORAL | 11 refills | Status: DC

## 2016-11-12 NOTE — Progress Notes (Signed)
11/12/2016 3:22 PM   Brady Bryant 27-Feb-1940 [id number]  Referring provider: No referring provider defined for this encounter.  Chief Complaint  Patient presents with  . Erectile Dysfunction    HPI: The patient is a 76 year old gentleman who presents for annual follow-up.   1. Erectile dysfunction Managed well with Trimix. No complaints.  2. Right Hydrocele Status post hydrocelectomy in December 2016.    3.  BPH The patient endorses new onset nocturia, weak stream, hesitancy, and urgency.  He did not have this problem before.  They have slowly started over the last year.  He has not tried medications for BPH before.  No change in cardiac status over the last year.   PMH: Past Medical History:  Diagnosis Date  . Anxiety and depression   . BPH (benign prostatic hyperplasia)   . ED (erectile dysfunction)   . Hepatitis   . Hydrocele, right   . Hyperlipemia   . Hypertension   . Hypogonadism in male   . Peyronie disease   . Sleep apnea     Surgical History: Past Surgical History:  Procedure Laterality Date  . CATARACT EXTRACTION Right   . EYE SURGERY Right   . FINGER AMPUTATION  1987  . HYDROCELE EXCISION  [id number]  . VARICOCELECTOMY  1990   Brady Bryant    Home Medications:  Allergies as of 11/12/2016   No Known Allergies     Medication List       Accurate as of 11/12/16  3:22 PM. Always use your most recent med list.          tamsulosin 0.4 MG Caps capsule Commonly known as:  FLOMAX Take 1 capsule (0.4 mg total) by mouth daily.       Allergies: No Known Allergies  Family History: Family History  Problem Relation Age of Onset  . Stomach cancer Mother   . Prostate cancer Neg Hx     Social History:  reports that he has quit smoking. He has never used smokeless tobacco. He reports that he drinks alcohol. He reports that he does not use drugs.  ROS: UROLOGY Frequent Urination?: Yes Hard to postpone urination?: Yes Burning/pain with  urination?: No Get up at night to urinate?: Yes Leakage of urine?: No Urine stream starts and stops?: Yes Trouble starting stream?: No Do you have to strain to urinate?: No Blood in urine?: No Urinary tract infection?: No Sexually transmitted disease?: No Injury to kidneys or bladder?: No Painful intercourse?: No Weak stream?: Yes Erection problems?: Yes Penile pain?: No  Gastrointestinal Nausea?: No Vomiting?: No Indigestion/heartburn?: No Diarrhea?: No Constipation?: No  Constitutional Fever: No Night sweats?: No Weight loss?: No Fatigue?: No  Skin Skin rash/lesions?: No Itching?: No  Eyes Blurred vision?: No Double vision?: No  Ears/Nose/Throat Sore throat?: No Sinus problems?: No  Hematologic/Lymphatic Swollen glands?: No Easy bruising?: No  Cardiovascular Leg swelling?: No Chest pain?: No  Respiratory Cough?: No Shortness of breath?: No  Endocrine Excessive thirst?: No  Musculoskeletal Back pain?: No Joint pain?: No  Neurological Headaches?: Yes Dizziness?: No  Psychologic Depression?: Yes Anxiety?: Yes  Physical Exam: BP (!) 188/106 (BP Location: Right Arm, Patient Position: Sitting, Cuff Size: Normal)   Pulse 81   Ht 5\' 7"  (1.702 m)   Constitutional:  Alert and oriented, No acute distress. HEENT: [place] AT, moist mucus membranes.  Trachea midline, no masses. Cardiovascular: No clubbing, cyanosis, or edema. Respiratory: Normal respiratory effort, no increased work of breathing. GI: Abdomen is soft, nontender, nondistended,  no abdominal masses GU: No CVA tenderness.  Skin: No rashes, bruises or suspicious lesions. Lymph: No cervical or inguinal adenopathy. Neurologic: Grossly intact, no focal deficits, moving all 4 extremities. Psychiatric: Normal mood and affect.  Laboratory Data: Lab Results  Component Value Date   WBC 8.8 10/15/2011   HGB 15.7 10/15/2011   HCT 46.5 10/15/2011   MCV 91 10/15/2011   PLT 329 10/15/2011     Lab Results  Component Value Date   CREATININE 0.92 10/15/2011    No results found for: PSA  No results found for: TESTOSTERONE  No results found for: HGBA1C  Urinalysis    Component Value Date/Time   APPEARANCEUR Clear 10/25/2014 0920   GLUCOSEU Negative 10/25/2014 0920   BILIRUBINUR Negative 10/25/2014 0920   PROTEINUR Negative 10/25/2014 0920   NITRITE Negative 10/25/2014 0920   LEUKOCYTESUR Negative 10/25/2014 0920    Assessment & Plan:   1. Erectile dysfunction Continue Trimix  2.  BPH We will start the patient on Flomax.  He was warned of the risk of orthostatic hypotension and retrograde ejaculation.  He will follow-up in 3 months.  However, if he is doing well from this medicationand  is happy with his improvement, he will cancel his appointment and change it to an annual appointment.  Return in about 3 months (around 02/12/2017).  Hildred LaserBrian James Sylvester Salonga, MD  [facility] [address], [address] [place], [place] [address] [phone]

## 2017-01-31 ENCOUNTER — Telehealth: Payer: Self-pay | Admitting: Urology

## 2017-01-31 NOTE — Telephone Encounter (Signed)
Trimix refill called [facility]

## 2017-01-31 NOTE — Telephone Encounter (Signed)
Pt called [facility] asking for a refill to be called in at [facility]. Pt did not mention name of medication needed.  Please advise. Thanks. [phone]

## 2017-02-17 ENCOUNTER — Ambulatory Visit: Payer: Medicare Other

## 2017-10-10 ENCOUNTER — Encounter: Payer: Self-pay | Admitting: Urology

## 2017-10-10 ENCOUNTER — Ambulatory Visit (INDEPENDENT_AMBULATORY_CARE_PROVIDER_SITE_OTHER): Payer: Medicare Other | Admitting: Urology

## 2017-10-10 VITALS — BP 156/96 | HR 109 | Ht 67.0 in | Wt 149.6 lb

## 2017-10-10 DIAGNOSIS — N430 Encysted hydrocele: Secondary | ICD-10-CM | POA: Diagnosis not present

## 2017-10-10 DIAGNOSIS — N529 Male erectile dysfunction, unspecified: Secondary | ICD-10-CM | POA: Diagnosis not present

## 2017-10-10 DIAGNOSIS — N400 Enlarged prostate without lower urinary tract symptoms: Secondary | ICD-10-CM

## 2017-10-10 LAB — BLADDER SCAN AMB NON-IMAGING

## 2017-10-11 NOTE — Progress Notes (Signed)
10/10/2017 7:23 AM   Brady Bryant 07/07/1940 [id number]  Referring provider: No referring provider defined for this encounter.  Chief Complaint  Patient presents with  . Follow-up    HPI: 77 year old male followed for erectile dysfunction on Trimix.  He saw Dr. Sherryl BartersBudzyn in October 2018 with complaints of nocturia, weak stream, hesitancy and urgency.  He was started on tamsulosin however the patient states after reading the potential side effects he elected not to start this medication.  He states his voiding patterns are stable and not bothersome enough that he desires to take medication.  He has occasional sensation of incomplete emptying, frequency and rare intermittency, urgency and weak stream.  He has nocturia x2-3.  IPSS completed today was 11/35 with a QoL rated 2/6.   PMH: Past Medical History:  Diagnosis Date  . Anxiety and depression   . BPH (benign prostatic hyperplasia)   . ED (erectile dysfunction)   . Hepatitis   . Hydrocele, right   . Hyperlipemia   . Hypertension   . Hypogonadism in male   . Peyronie disease   . Sleep apnea     Surgical History: Past Surgical History:  Procedure Laterality Date  . CATARACT EXTRACTION Right   . EYE SURGERY Right   . FINGER AMPUTATION  1987  . HYDROCELE EXCISION  [id number]  . VARICOCELECTOMY  1990   [facility]    Home Medications:  Allergies as of 10/10/2017   No Known Allergies     Medication List        Accurate as of 10/10/17 11:59 PM. Always use your most recent med list.          tamsulosin 0.4 MG Caps capsule Commonly known as:  FLOMAX Take 1 capsule (0.4 mg total) by mouth daily.       Allergies: No Known Allergies  Family History: Family History  Problem Relation Age of Onset  . Stomach cancer Mother   . Prostate cancer Neg Hx     Social History:  reports that he has quit smoking. He has never used smokeless tobacco. He reports that he drinks alcohol. He reports that he does not use  drugs.  ROS: UROLOGY Frequent Urination?: Yes Hard to postpone urination?: No Burning/pain with urination?: No Get up at night to urinate?: Yes Leakage of urine?: No Urine stream starts and stops?: No Trouble starting stream?: No Do you have to strain to urinate?: No Blood in urine?: No Urinary tract infection?: No Sexually transmitted disease?: No Injury to kidneys or bladder?: No Painful intercourse?: No Weak stream?: Yes Erection problems?: Yes Penile pain?: No  Gastrointestinal Nausea?: No Vomiting?: No Indigestion/heartburn?: No Diarrhea?: No Constipation?: No  Constitutional Fever: No Night sweats?: No Weight loss?: No Fatigue?: No  Skin Skin rash/lesions?: No Itching?: No  Eyes Blurred vision?: No Double vision?: No  Ears/Nose/Throat Sore throat?: No Sinus problems?: No  Hematologic/Lymphatic Swollen glands?: No Easy bruising?: No  Cardiovascular Leg swelling?: No Chest pain?: No  Respiratory Cough?: No Shortness of breath?: No  Endocrine Excessive thirst?: No  Musculoskeletal Back pain?: No Joint pain?: No  Neurological Headaches?: No Dizziness?: No  Psychologic Depression?: No Anxiety?: No  Physical Exam: BP (!) 156/96 (BP Location: Left Arm, Patient Position: Sitting, Cuff Size: Normal)   Pulse (!) 109   Ht 5\' 7"  (1.702 m)   Wt 149 lb 9.6 oz (67.9 kg)   BMI 23.43 kg/m   Constitutional:  Alert and oriented, No acute distress. HEENT: [place] AT, moist mucus  membranes.  Trachea midline, no masses. Cardiovascular: No clubbing, cyanosis, or edema. Respiratory: Normal respiratory effort, no increased work of breathing. GI: Abdomen is soft, nontender, nondistended, no abdominal masses GU: No CVA tenderness Lymph: No cervical or inguinal lymphadenopathy. Skin: No rashes, bruises or suspicious lesions. Neurologic: Grossly intact, no focal deficits, moving all 4 extremities. Psychiatric: Normal mood and affect.   Assessment &  Plan:   77 year old male with moderate lower urinary tract symptoms which are not bothersome.  He does not desire to initiate medical management.  PVR by bladder scan today was 0 mL.  ED managed with Trimix with good efficacy.  He did not need a refill.  Return in about 1 year (around 10/11/2018) for Recheck.  Riki Altes, MD  [facility] [address], [address] [place], [place] [address] [phone]

## 2017-10-17 ENCOUNTER — Encounter: Payer: Self-pay | Admitting: Urology

## 2017-11-17 ENCOUNTER — Ambulatory Visit: Payer: Medicare Other

## 2018-03-07 ENCOUNTER — Other Ambulatory Visit: Payer: Self-pay

## 2018-03-07 DIAGNOSIS — N529 Male erectile dysfunction, unspecified: Secondary | ICD-10-CM

## 2018-03-07 MED ORDER — UNABLE TO FIND: 3 refills | Status: DC

## 2018-03-07 NOTE — Telephone Encounter (Signed)
Called [facility] refilled Trimix injections at current dose 30/1/10 5cc w/3 refills.

## 2018-10-11 ENCOUNTER — Ambulatory Visit: Payer: Medicare Other | Admitting: Urology

## 2018-10-11 ENCOUNTER — Encounter: Payer: Self-pay | Admitting: Urology

## 2021-05-27 ENCOUNTER — Ambulatory Visit (INDEPENDENT_AMBULATORY_CARE_PROVIDER_SITE_OTHER): Payer: Medicare Other | Admitting: Urology

## 2021-05-27 ENCOUNTER — Encounter: Payer: Self-pay | Admitting: Urology

## 2021-05-27 VITALS — BP 106/73 | HR 56 | Ht 67.0 in | Wt 150.0 lb

## 2021-05-27 DIAGNOSIS — N5201 Erectile dysfunction due to arterial insufficiency: Secondary | ICD-10-CM

## 2021-05-27 DIAGNOSIS — R350 Frequency of micturition: Secondary | ICD-10-CM

## 2021-05-27 DIAGNOSIS — N401 Enlarged prostate with lower urinary tract symptoms: Secondary | ICD-10-CM | POA: Diagnosis not present

## 2021-05-27 LAB — URINALYSIS, COMPLETE
Bilirubin, UA: NEGATIVE
Glucose, UA: NEGATIVE
Ketones, UA: NEGATIVE
Leukocytes,UA: NEGATIVE
Nitrite, UA: NEGATIVE
RBC, UA: NEGATIVE
Specific Gravity, UA: 1.02 (ref 1.005–1.030)
Urobilinogen, Ur: 0.2 mg/dL (ref 0.2–1.0)
pH, UA: 6.5 (ref 5.0–7.5)

## 2021-05-27 LAB — MICROSCOPIC EXAMINATION

## 2021-05-27 LAB — BLADDER SCAN AMB NON-IMAGING: Scan Result: 21

## 2021-05-27 MED ORDER — AMBULATORY NON FORMULARY MEDICATION: 6 refills | Status: DC

## 2021-05-27 NOTE — Progress Notes (Signed)
? ?05/27/2021 ?1:43 PM  ? ?Brady Bryant ?[birth year] ?[id number] ? ?Referring provider: No referring provider defined for this encounter. ? ?Chief Complaint  ?Patient presents with  ? Urinary Frequency  ? ? ?HPI: ?Brady Bryant is a 81 y.o. male who presents for a reestablish visit. ? ?Last seen 10/10/2017 for ED and BPH ?At that time he was on tamsulosin but had no bothersome lower urinary tract symptoms ?Recently has had worsening frequency, urgency during the day ?No urinary incontinence ?Nocturia x4-5 ?Per patient no history of sleep apnea or snoring ?Denies dysuria, gross hematuria ?No flank, abdominal or pelvic pain ?Taking sildenafil for ED but desires to restart Trimix ?IPSS today 26/35 ? ? ?PMH: ?Past Medical History:  ?Diagnosis Date  ? Anxiety and depression   ? BPH (benign prostatic hyperplasia)   ? ED (erectile dysfunction)   ? Hepatitis   ? Hydrocele, right   ? Hyperlipemia   ? Hypertension   ? Hypogonadism in male   ? Peyronie disease   ? Sleep apnea   ? ? ?Surgical History: ?Past Surgical History:  ?Procedure Laterality Date  ? CATARACT EXTRACTION Right   ? EYE SURGERY Right   ? FINGER AMPUTATION  1987  ? HYDROCELE EXCISION  [id number]  ? VARICOCELECTOMY  1990  ? [facility]  ? ? ?Home Medications:  ?Allergies as of 05/27/2021   ?No Known Allergies ?  ? ?  ?Medication List  ?  ? ?  ? Accurate as of May 27, 2021  1:43 PM. If you have any questions, ask your nurse or doctor.  ?  ?  ? ?  ? ?amLODipine 10 MG tablet ?Commonly known as: NORVASC ?Take 10 mg by mouth daily. ?  ?atorvastatin 20 MG tablet ?Commonly known as: LIPITOR ?Take 20 mg by mouth daily. ?  ?magnesium citrate solution ?Take 296 mLs by mouth once. ?  ?sildenafil 100 MG tablet ?Commonly known as: VIAGRA ?Take 100 mg by mouth daily as needed for erectile dysfunction. ?  ?tamsulosin 0.4 MG Caps capsule ?Commonly known as: FLOMAX ?Take 1 capsule (0.4 mg total) by mouth daily. ?  ?UNABLE TO FIND ?Med Name: Trimix Injection 30/1/10 5cc ?  ?vitamin C 1000  MG tablet ?Take 1,000 mg by mouth daily. ?  ?zinc gluconate 50 MG tablet ?Take 50 mg by mouth daily. ?  ? ?  ? ? ?Allergies: No Known Allergies ? ?Family History: ?Family History  ?Problem Relation Age of Onset  ? Stomach cancer Mother   ? Prostate cancer Neg Hx   ? ? ?Social History:  reports that he has quit smoking. He has never used smokeless tobacco. He reports current alcohol use. He reports that he does not use drugs. ? ? ?Physical Exam: ?BP 106/73   Pulse (!) 56   Ht 5\' 7"  (1.702 m)   Wt 150 lb (68 kg)   BMI 23.49 kg/m?   ?Constitutional:  Alert and oriented, No acute distress. ?HEENT: [place] AT, moist mucus membranes.  Trachea midline, no masses. ?Cardiovascular: No clubbing, cyanosis, or edema. ?Respiratory: Normal respiratory effort, no increased work of breathing. ?GU: Prostate 60 g, smooth without nodules ?Skin: No rashes, bruises or suspicious lesions. ?Neurologic: Grossly intact, no focal deficits, moving all 4 extremities. ?Psychiatric: Normal mood and affect. ? ?Laboratory Data: ? ?Urinalysis ?Micro neg ? ? ?Assessment & Plan:   ? ?1.  BPH with LUTS ?Most bothersome symptoms are storage related ?Add Myrbetriq 25 mg daily-samples given ?Continue tamsulosin ?PVR today 21 mL ?Schedule annual follow-up  but was instructed to call back if voiding symptoms still bothersome ? ?2.  Erectile dysfunction ?Requested Trimix refill ? ? ? ?Riki Altes, MD ? ?[facility] ?[address], [address] ?[place], [place] [address] ?[phone] ?

## 2021-06-22 ENCOUNTER — Other Ambulatory Visit: Payer: Self-pay | Admitting: *Deleted

## 2021-06-22 DIAGNOSIS — R350 Frequency of micturition: Secondary | ICD-10-CM

## 2021-06-22 MED ORDER — MIRABEGRON ER 25 MG PO TB24: 25.0000 mg | ORAL_TABLET | Freq: Every day | ORAL | 12 refills | Status: DC

## 2022-06-03 ENCOUNTER — Encounter: Payer: Self-pay | Admitting: Urology

## 2022-06-03 ENCOUNTER — Ambulatory Visit (INDEPENDENT_AMBULATORY_CARE_PROVIDER_SITE_OTHER): Payer: Medicare Other | Admitting: Urology

## 2022-06-03 DIAGNOSIS — N401 Enlarged prostate with lower urinary tract symptoms: Secondary | ICD-10-CM | POA: Diagnosis not present

## 2022-06-03 DIAGNOSIS — R350 Frequency of micturition: Secondary | ICD-10-CM

## 2022-06-03 DIAGNOSIS — N528 Other male erectile dysfunction: Secondary | ICD-10-CM

## 2022-06-03 DIAGNOSIS — R351 Nocturia: Secondary | ICD-10-CM

## 2022-06-03 LAB — BLADDER SCAN AMB NON-IMAGING: Scan Result: 85

## 2022-06-03 MED ORDER — AMBULATORY NON FORMULARY MEDICATION: 6 refills | Status: DC

## 2022-06-03 MED ORDER — MIRABEGRON ER 25 MG PO TB24: 25.0000 mg | ORAL_TABLET | Freq: Every day | ORAL | 12 refills | Status: DC

## 2022-06-03 NOTE — Progress Notes (Signed)
Marcelle Overlie Plume,acting as a scribe for Riki Altes, MD.,have documented all relevant documentation on the behalf of Riki Altes, MD,as directed by  Riki Altes, MD while in the presence of Riki Altes, MD.  05/27/2021 1:39 PM   Brady Bryant 04-05-1940 [id number]  Chief Complaint  Patient presents with   Urinary Frequency    HPI: Brady Bryant is a 82 y.o. male who presents for a follow up visit.   At his last office visit, he was having severe LUTs, IPSS 26/35 with frequency, urgency, and nocturia x 4-5 Given a trial of myrbetriq 25 mg daily Myrbetriq has decreased his urge incontinence, but he stills complains of nocturia x 4-5 with bladder volume similar to daytime volumes Utilizing Trimix for ED Remains on Tamsulosin   PMH: Past Medical History:  Diagnosis Date   Anxiety and depression    BPH (benign prostatic hyperplasia)    ED (erectile dysfunction)    Hepatitis    Hydrocele, right    Hyperlipemia    Hypertension    Hypogonadism in male    Peyronie disease    Sleep apnea     Surgical History: Past Surgical History:  Procedure Laterality Date   CATARACT EXTRACTION Right    EYE SURGERY Right    FINGER AMPUTATION  1987   HYDROCELE EXCISION  [id number]   VARICOCELECTOMY  1990   [facility]    Home Medications:  Allergies as of 06/03/2022   No Known Allergies      Medication List        Accurate as of Jun 03, 2022  1:39 PM. If you have any questions, ask your nurse or doctor.          AMBULATORY NON FORMULARY MEDICATION Trimix (30/1/10)-(Pap/Phent/PGE)  Dosage: Inject 0.25-.7ml per injection   Vial 1ml  Qty #10 Refills 6  [facility] [phone] Fax [phone]   amLODipine 10 MG tablet Commonly known as: NORVASC Take 10 mg by mouth daily.   atorvastatin 20 MG tablet Commonly known as: LIPITOR Take 20 mg by mouth daily.   magnesium citrate solution Take 296 mLs by mouth once.   mirabegron ER 25 MG Tb24  tablet Commonly known as: Myrbetriq Take 1 tablet (25 mg total) by mouth daily.   sildenafil 100 MG tablet Commonly known as: VIAGRA Take 100 mg by mouth daily as needed for erectile dysfunction.   tamsulosin 0.4 MG Caps capsule Commonly known as: FLOMAX Take 1 capsule (0.4 mg total) by mouth daily.   UNABLE TO FIND Med Name: Trimix Injection 30/1/10 5cc   vitamin C 1000 MG tablet Take 1,000 mg by mouth daily.   zinc gluconate 50 MG tablet Take 50 mg by mouth daily.        Family History: Family History  Problem Relation Age of Onset   Stomach cancer Mother    Prostate cancer Neg Hx     Social History:  reports that he has quit smoking. He has never used smokeless tobacco. He reports current alcohol use. He reports that he does not use drugs.   Physical Exam: There were no vitals taken for this visit.  Constitutional:  Alert and oriented, No acute distress. HEENT: [place] AT Respiratory: Normal respiratory effort, no increased work of breathing. Psychiatric: Normal mood and affect.   Assessment & Plan:    1.  BPH with LUTS Stable, on tamsulosin and myrbetriq Myrbetriq was refilled  2. Nocturia No history of sleep apnea He does  have nocturnal voided volumes similar to daytime and may have nocturnal polyuria.  Discussed a 24 hour voiding diary and the possibility of desmopressin, however, he states he does not desire further evaluation or treatment for the nocturia  3. Erectile dysfunction Stable on Trimix, which was refilled Continue annual follow up  I have reviewed the above documentation for accuracy and completeness, and I agree with the above.   Riki Altes, MD  [facility] [address], [address] [place], [place] [address] [phone]

## 2023-06-03 ENCOUNTER — Ambulatory Visit (INDEPENDENT_AMBULATORY_CARE_PROVIDER_SITE_OTHER): Payer: Self-pay | Admitting: Urology

## 2023-06-03 ENCOUNTER — Other Ambulatory Visit: Payer: Self-pay | Admitting: Urology

## 2023-06-03 VITALS — BP 95/62 | HR 103 | Ht 67.0 in | Wt 140.0 lb

## 2023-06-03 DIAGNOSIS — R351 Nocturia: Secondary | ICD-10-CM | POA: Diagnosis not present

## 2023-06-03 DIAGNOSIS — N401 Enlarged prostate with lower urinary tract symptoms: Secondary | ICD-10-CM

## 2023-06-03 DIAGNOSIS — N528 Other male erectile dysfunction: Secondary | ICD-10-CM | POA: Diagnosis not present

## 2023-06-03 MED ORDER — TROSPIUM CHLORIDE 20 MG PO TABS: ORAL_TABLET | ORAL | 0 refills | Status: DC

## 2023-06-03 MED ORDER — AMBULATORY NON FORMULARY MEDICATION: 6 refills | Status: AC

## 2023-06-03 NOTE — Progress Notes (Signed)
 I, Brady Bryant, acting as a Neurosurgeon for Brady Knapp, MD., have documented all relevant documentation on the behalf of Brady Knapp, MD, as directed by Brady Knapp, MD while in the presence of Brady Knapp, MD.  Discussed the use of AI scribe software for clinical note transcription with the patient, who gave verbal consent to proceed.   06/03/2023 3:22 PM   Brady Bryant Dec 21, 1940 [id number]  Chief Complaint  Patient presents with   Benign Prostatic Hypertrophy    HPI: Brady Bryant is a 83 y.o. male presents for annual follow-up.  His only complaint today has been nocturia. At our last visit he did not elect any additional treatment, but is interested in pursuing treatment.  No longer taking Myrbetriq  for storage late voiding symptoms due to expense, and he did not feel it was effective.  No longer taking tamsulosin  for his chronic pain and states he has no bothersome daytime symptoms.  He provided information today that he has been taking doxycycline for several years for his prostate because it "keeps the swelling down'. He states this was initially prescribed several years ago by Dr. Harman, and his primary care physician has currently been prescribing it.  Chart review negative for recent UTI or positive urine cultures.   PMH: Past Medical History:  Diagnosis Date   Anxiety and depression    BPH (benign prostatic hyperplasia)    ED (erectile dysfunction)    Hepatitis    Hydrocele, right    Hyperlipemia    Hypertension    Hypogonadism in male    Peyronie disease    Sleep apnea     Surgical History: Past Surgical History:  Procedure Laterality Date   CATARACT EXTRACTION Right    EYE SURGERY Right    FINGER AMPUTATION  1987   HYDROCELE EXCISION  [id number]   VARICOCELECTOMY  1990   Harman    Home Medications:  Allergies as of 06/03/2023   No Known Allergies      Medication List        Accurate as of Jun 03, 2023  3:22 PM. If you have  any questions, ask your nurse or doctor.          STOP taking these medications    magnesium citrate solution Stopped by: Brady Bryant   mirabegron  ER 25 MG Tb24 tablet Commonly known as: Myrbetriq  Stopped by: Brady Bryant   sildenafil 100 MG tablet Commonly known as: VIAGRA Stopped by: Dalynn Jhaveri C Efrem Pitstick   tamsulosin  0.4 MG Caps capsule Commonly known as: FLOMAX  Stopped by: Brady Bryant   UNABLE TO FIND Stopped by: Brady Bryant   vitamin C 1000 MG tablet Stopped by: Brady Bryant   zinc gluconate 50 MG tablet Stopped by: Brady Bryant       TAKE these medications    AMBULATORY NON FORMULARY MEDICATION Trimix (30/1/10)-(Pap/Phent/PGE)  Dosage: Inject 0.25-.50ml per injection   Vial 1ml  Qty #10 Refills 6  [facility] [phone] Fax [phone]   amLODipine 10 MG tablet Commonly known as: NORVASC Take 10 mg by mouth daily.   atorvastatin 20 MG tablet Commonly known as: LIPITOR Take 20 mg by mouth daily.   CENTRUM SILVER PO Take by mouth.   cetirizine 10 MG tablet Commonly known as: ZYRTEC Take 10 mg by mouth daily.   DOXYCYCLINE MONOHYDRATE PO Take by mouth. 100 mg daily   OVER THE COUNTER MEDICATION VITAMIN D3, K2, MAGNESIUM, CALCIUM,  ZINC   trospium  20 MG tablet Commonly known as: SANCTURA  1 HOUR PRIOR TO BEDTIME Started by: Brady Bryant   vitamin B-12 100 MCG tablet Commonly known as: CYANOCOBALAMIN Take 100 mcg by mouth daily.        Allergies: No Known Allergies  Family History: Family History  Problem Relation Age of Onset   Stomach cancer Mother    Prostate cancer Neg Hx     Social History:  reports that he has quit smoking. He has never used smokeless tobacco. He reports current alcohol use. He reports that he does not use drugs.   Physical Exam: BP 95/62   Pulse (!) 103   Ht 5\' 7"  (1.702 m)   Wt 140 lb (63.5 kg)   BMI 21.93 kg/m   Constitutional:  Alert and oriented, No acute  distress. HEENT:  AT Respiratory: Normal respiratory effort, no increased work of breathing. Psychiatric: Normal mood and affect.   Assessment & Plan:    1. Benign Prostatic Hyperplasia (BPH) Discussed that there is no indication for long-term doxycycline use for BPH. Daytime symptoms are currently not bothersome.  2. Nocturia Trial of trospium  one hour prior to bedtime. Patient will call back regarding efficacy. If not effective, recommend sleep study evaluation by PCP.  3. Erectile Dysfunction Trimix was refilled.  Will continue annual follow-up.  I have reviewed the above documentation for accuracy and completeness, and I agree with the above.    Brady Knapp, MD  [facility] [address] [place], [place] [address] [phone]

## 2023-06-05 ENCOUNTER — Encounter: Payer: Self-pay | Admitting: Urology

## 2024-01-16 ENCOUNTER — Other Ambulatory Visit: Payer: Self-pay | Admitting: Urology

## 2024-01-27 ENCOUNTER — Telehealth: Payer: Self-pay

## 2024-01-27 DIAGNOSIS — R351 Nocturia: Secondary | ICD-10-CM

## 2024-01-27 MED ORDER — TROSPIUM CHLORIDE 20 MG PO TABS: ORAL_TABLET | ORAL | 3 refills | Status: AC

## 2024-01-27 NOTE — Telephone Encounter (Signed)
 Called in refill of Trospium  for Nocturia for patient. He was given 30 days back in May and would like to continue that medication now.

## 2024-05-31 ENCOUNTER — Ambulatory Visit: Admitting: Urology
